# Patient Record
Sex: Female | Born: 1955 | Race: White | Hispanic: No | Marital: Married | Smoking: Former smoker
Health system: Southern US, Community
[De-identification: ages and names within clinical notes are randomized; demographics above are authoritative.]

## PROBLEM LIST (undated history)

## (undated) DIAGNOSIS — F419 Anxiety disorder, unspecified: Secondary | ICD-10-CM

## (undated) DIAGNOSIS — R079 Chest pain, unspecified: Secondary | ICD-10-CM

## (undated) DIAGNOSIS — F32A Depression, unspecified: Secondary | ICD-10-CM

## (undated) DIAGNOSIS — J069 Acute upper respiratory infection, unspecified: Secondary | ICD-10-CM

## (undated) DIAGNOSIS — C3490 Malignant neoplasm of unspecified part of unspecified bronchus or lung: Secondary | ICD-10-CM

## (undated) DIAGNOSIS — K219 Gastro-esophageal reflux disease without esophagitis: Secondary | ICD-10-CM

## (undated) DIAGNOSIS — F329 Major depressive disorder, single episode, unspecified: Secondary | ICD-10-CM

## (undated) HISTORY — DX: Major depressive disorder, single episode, unspecified: F32.9

## (undated) HISTORY — DX: Anxiety disorder, unspecified: F41.9

## (undated) HISTORY — DX: Gastro-esophageal reflux disease without esophagitis: K21.9

## (undated) HISTORY — DX: Chest pain, unspecified: R07.9

## (undated) HISTORY — DX: Depression, unspecified: F32.A

## (undated) HISTORY — DX: Acute upper respiratory infection, unspecified: J06.9

## (undated) HISTORY — PX: LUNG BIOPSY: SHX5088

## (undated) HISTORY — PX: DENTAL SURGERY: SHX609

---

## 2001-03-07 ENCOUNTER — Ambulatory Visit (HOSPITAL_COMMUNITY): Admission: RE | Admit: 2001-03-07 | Discharge: 2001-03-07 | Payer: Self-pay | Admitting: Gastroenterology

## 2001-03-07 ENCOUNTER — Encounter (INDEPENDENT_AMBULATORY_CARE_PROVIDER_SITE_OTHER): Payer: Self-pay | Admitting: *Deleted

## 2002-09-27 ENCOUNTER — Encounter: Admission: RE | Admit: 2002-09-27 | Discharge: 2002-09-27 | Payer: Self-pay | Admitting: Family Medicine

## 2002-09-27 ENCOUNTER — Encounter: Payer: Self-pay | Admitting: Family Medicine

## 2007-04-11 ENCOUNTER — Encounter: Admission: RE | Admit: 2007-04-11 | Discharge: 2007-04-11 | Payer: Self-pay | Admitting: Family Medicine

## 2007-04-27 ENCOUNTER — Encounter: Admission: RE | Admit: 2007-04-27 | Discharge: 2007-04-27 | Payer: Self-pay | Admitting: Family Medicine

## 2007-12-04 ENCOUNTER — Encounter: Admission: RE | Admit: 2007-12-04 | Discharge: 2007-12-04 | Payer: Self-pay | Admitting: Family Medicine

## 2009-01-22 ENCOUNTER — Encounter: Admission: RE | Admit: 2009-01-22 | Discharge: 2009-01-22 | Payer: Self-pay | Admitting: Family Medicine

## 2010-09-10 NOTE — Procedures (Signed)
[place]. [facility]  Patient:    MARIVEL, MCCLARTY Visit Number: [id number] MRN: [id number]          Service Type: END Location: ENDO Attending Physician:  Charna Elizabeth Dictated by:   Anselmo Rod, M.D. Proc. Date: 03/07/01 Admit Date:  03/07/2001   CC:         Gloriajean Dell. Andrey Campanile, M.D./Maureen Renette Butters, F.N.P.                           Procedure Report  DATE OF BIRTH:  December 24, 1955  PROCEDURE PERFORMED:  Colonoscopy with biopsies.  ENDOSCOPIST:  Anselmo Rod, M.D.  INSTRUMENT USED:  Pediatric Olympus colonoscope.  INDICATION FOR PROCEDURE:  Blood in stool and a history of rectal bleeding in a 55 year old white female.  The patient gives a history of weight loss in the recent past but this she claims has been intentional.  There is no family history of colon cancer.  PREPROCEDURE PREPARATION:  Informed consent was procured from the patient. The patient was fasted for eight hours prior to the procedure and prepped with a bottle of magnesium citrate and a gallon of NuLytely the night prior to the procedure.  PREPROCEDURE PHYSICAL:  VITAL SIGNS:  The patient had vital signs.  NECK:  Supple.  CHEST: Clear to auscultation.  S1 and S2 regular.  ABDOMEN:  Soft with normal bowel sounds.  DESCRIPTION OF PROCEDURE:  The patient was placed in the left lateral decubitus position and sedated with 50 mg of Demerol and 5 mg of Versed intravenously.  Once the patient was adequately sedated and maintained on low flow oxygen and continuous cardiac monitoring, the Olympus video colonoscope was advanced from the rectum to the cecum without difficulty.  The patient had an excellent prep except for a few early left sided diverticula.  The entire colon appeared normal up to the terminal ileum.  Three small sessile polyps were biopsied with a cold biopsy forceps from the rectum.  Small internal hemorrhoids are appreciated on retroflexion of the  rectum.  IMPRESSION: 1. Small sessile polyps, question hyperplastic.  Biopsied from the rectum. 2. Early left sided diverticulosis. 3. Small internal hemorrhoids. 4. Otherwise normal appearing colon up to the terminal ileum.  RECOMMENDATIONS: 1. Repeat guaiac testing will be done on an outpatient basis. 2. If the patient continues to have guaiac positive stools an    esophagogastroduodenoscopy and possible small bowel follow through    will be undertaken. 3. Outpatient follow up within the next ten days.Dictated by:   Anselmo Rod, M.D. Attending Physician:  Charna Elizabeth DD:  03/07/01 TD:  03/07/01 Job: [id number]

## 2012-02-08 ENCOUNTER — Other Ambulatory Visit: Payer: Self-pay | Admitting: Family Medicine

## 2012-02-08 DIAGNOSIS — Z1231 Encounter for screening mammogram for malignant neoplasm of breast: Secondary | ICD-10-CM

## 2012-03-07 ENCOUNTER — Ambulatory Visit
Admission: RE | Admit: 2012-03-07 | Discharge: 2012-03-07 | Disposition: A | Discharge status: Home / Self Care | Payer: 59 | Source: Ambulatory Visit | Attending: Family Medicine | Admitting: Family Medicine

## 2012-03-07 DIAGNOSIS — Z1231 Encounter for screening mammogram for malignant neoplasm of breast: Secondary | ICD-10-CM

## 2014-11-10 ENCOUNTER — Other Ambulatory Visit: Payer: Self-pay | Admitting: Family Medicine

## 2014-11-10 ENCOUNTER — Other Ambulatory Visit: Payer: Self-pay

## 2014-11-10 DIAGNOSIS — Z1231 Encounter for screening mammogram for malignant neoplasm of breast: Secondary | ICD-10-CM

## 2014-11-13 ENCOUNTER — Ambulatory Visit: Payer: Self-pay

## 2014-11-19 ENCOUNTER — Ambulatory Visit
Admission: RE | Admit: 2014-11-19 | Discharge: 2014-11-19 | Disposition: A | Discharge status: Home / Self Care | Payer: 59 | Source: Ambulatory Visit | Attending: Family Medicine | Admitting: Family Medicine

## 2014-11-19 DIAGNOSIS — Z1231 Encounter for screening mammogram for malignant neoplasm of breast: Secondary | ICD-10-CM

## 2014-11-20 ENCOUNTER — Other Ambulatory Visit: Payer: Self-pay | Admitting: Family Medicine

## 2014-11-20 DIAGNOSIS — R928 Other abnormal and inconclusive findings on diagnostic imaging of breast: Secondary | ICD-10-CM

## 2014-11-25 ENCOUNTER — Ambulatory Visit
Admission: RE | Admit: 2014-11-25 | Discharge: 2014-11-25 | Disposition: A | Discharge status: Home / Self Care | Payer: 59 | Source: Ambulatory Visit | Attending: Family Medicine | Admitting: Family Medicine

## 2014-11-25 DIAGNOSIS — R928 Other abnormal and inconclusive findings on diagnostic imaging of breast: Secondary | ICD-10-CM

## 2016-01-12 ENCOUNTER — Other Ambulatory Visit: Payer: Self-pay | Admitting: Family Medicine

## 2016-01-12 DIAGNOSIS — Z1231 Encounter for screening mammogram for malignant neoplasm of breast: Secondary | ICD-10-CM

## 2016-01-19 ENCOUNTER — Ambulatory Visit
Admission: RE | Admit: 2016-01-19 | Discharge: 2016-01-19 | Disposition: A | Discharge status: Home / Self Care | Payer: 59 | Source: Ambulatory Visit | Attending: Family Medicine | Admitting: Family Medicine

## 2016-01-19 DIAGNOSIS — Z1231 Encounter for screening mammogram for malignant neoplasm of breast: Secondary | ICD-10-CM

## 2016-01-27 ENCOUNTER — Other Ambulatory Visit: Payer: Self-pay | Admitting: Family Medicine

## 2016-01-27 DIAGNOSIS — R928 Other abnormal and inconclusive findings on diagnostic imaging of breast: Secondary | ICD-10-CM

## 2016-02-16 ENCOUNTER — Ambulatory Visit
Admission: RE | Admit: 2016-02-16 | Discharge: 2016-02-16 | Disposition: A | Discharge status: Home / Self Care | Payer: 59 | Source: Ambulatory Visit | Attending: Family Medicine | Admitting: Family Medicine

## 2016-02-16 DIAGNOSIS — R928 Other abnormal and inconclusive findings on diagnostic imaging of breast: Secondary | ICD-10-CM

## 2018-06-13 ENCOUNTER — Other Ambulatory Visit: Payer: Self-pay | Admitting: Family Medicine

## 2018-06-13 DIAGNOSIS — Z1231 Encounter for screening mammogram for malignant neoplasm of breast: Secondary | ICD-10-CM

## 2018-06-15 ENCOUNTER — Telehealth: Payer: Self-pay

## 2018-06-15 NOTE — Telephone Encounter (Signed)
NOTES ON FILE 

## 2018-07-04 ENCOUNTER — Other Ambulatory Visit: Payer: Self-pay

## 2018-07-04 ENCOUNTER — Ambulatory Visit: Payer: 59 | Admitting: Cardiology

## 2018-07-04 VITALS — BP 118/72 | HR 87 | Ht 61.0 in | Wt 131.0 lb

## 2018-07-04 DIAGNOSIS — R0602 Shortness of breath: Secondary | ICD-10-CM | POA: Diagnosis not present

## 2018-07-04 DIAGNOSIS — R079 Chest pain, unspecified: Secondary | ICD-10-CM | POA: Diagnosis not present

## 2018-07-04 NOTE — Progress Notes (Signed)
Cardiology Office Note    Date:  07/09/2018   ID:  Rebecca May, Rebecca May 1955/09/24, MRN [id number]  PCP:  Eulas Post, MD  Cardiologist:  Fransico Him, MD   Chief Complaint  Patient presents with  . Chest Pain  . Shortness of Breath    History of Present Illness:  Rebecca May is a 63 y.o. female who is being seen today for the evaluation of atypical chest pain at the request of Aletha Halim., PA-C.  This is a 63 year old female with a history of anxiety, GERD, depression referred by her PCP for work-up of a atypical chest pain.  She recently saw her PCP for routine physical exam and mention that she had been having chest pain off and on and will occasionally get upper back pain and jaw pain as well.  She says when she gets it she will rest for a few minutes and the pain usually subsides.    She says that it occurs sporadically a few times a month and then may not have an episode for a few months.  She describes it as a left sided burning occasionally with radiation to the left shoulder and jaw.  It usually lasts 30-40 seconds.  It can occur with exertion and at rest and is associated with SOB but no nausea or diaphoresis.   She does notice some fatigue on occasion.  EKG showed normal sinus rhythm with no ST changes.  Denies any PND, orthopnea, palpitations, dizziness or syncope.  Denies lower extremity edema.  She smoked 1ppd for 40 years.  She has chronic DOE due to smoking.    Past Medical History:  Diagnosis Date  . Anxiety   . Chest pain   . Depression   . GERD (gastroesophageal reflux disease)   . URI (upper respiratory infection)     Past Surgical History:  Procedure Laterality Date  . DENTAL SURGERY      Current Medications: Current Meds  Medication Sig  . citalopram (CELEXA) 20 MG tablet 20 mg. TAKE 1 &1/2 TABLETS BY MOUTH DAILY  . LORazepam (ATIVAN) 0.5 MG tablet 0.5 mg. TAKE 1 TO 2 TABLETS BY MOUTH DAILY AS NEEDED  . pantoprazole (PROTONIX) 40  MG tablet Take 40 mg by mouth daily.    Allergies:   Patient has no known allergies.   Social History   Socioeconomic History  . Marital status: Married    Spouse name: Not on file  . Number of children: Not on file  . Years of education: Not on file  . Highest education level: Not on file  Occupational History  . Not on file  Social Needs  . Financial resource strain: Not on file  . Food insecurity:    Worry: Not on file    Inability: Not on file  . Transportation needs:    Medical: Not on file    Non-medical: Not on file  Tobacco Use  . Smoking status: Not on file  Substance and Sexual Activity  . Alcohol use: Not on file  . Drug use: Not on file  . Sexual activity: Not on file  Lifestyle  . Physical activity:    Days per week: Not on file    Minutes per session: Not on file  . Stress: Not on file  Relationships  . Social connections:    Talks on phone: Not on file    Gets together: Not on file    Attends religious service: Not on file  Active member of club or organization: Not on file    Attends meetings of clubs or organizations: Not on file    Relationship status: Not on file  Other Topics Concern  . Not on file  Social History Narrative  . Not on file     Family History:  The patient's family history includes Breast cancer (age of onset: 36) in her mother; Hypertension in her father and mother.   ROS:   Please see the history of present illness.    ROS All other systems reviewed and are negative.  No flowsheet data found.     PHYSICAL EXAM:   VS:  BP 118/72 (BP Location: Right Arm, Patient Position: Sitting, Cuff Size: Normal)   Pulse 87   Ht 5\' 1"  (1.549 m)   Wt 131 lb (59.4 kg)   BMI 24.75 kg/m    GEN: Well nourished, well developed, in no acute distress  HEENT: normal  Neck: no JVD, carotid bruits, or masses Cardiac: RRR; no murmurs, rubs, or gallops,no edema.  Intact distal pulses bilaterally.  Respiratory:  clear to auscultation  bilaterally, normal work of breathing GI: soft, nontender, nondistended, + BS MS: no deformity or atrophy  Skin: warm and dry, no rash Neuro:  Alert and Oriented x 3, Strength and sensation are intact Psych: euthymic mood, full affect  Wt Readings from Last 3 Encounters:  07/04/18 131 lb (59.4 kg)      Studies/Labs Reviewed:   EKG:  EKG is not ordered today.   Recent Labs: No results found for requested labs within last 8760 hours.   Lipid Panel No results found for: CHOL, TRIG, HDL, CHOLHDL, VLDL, LDLCALC, LDLDIRECT  Additional studies/ records that were reviewed today include:  Office notes from PCP    ASSESSMENT:    1. Chest pain, unspecified type   2. SOB (shortness of breath)      PLAN:  In order of problems listed above:  1.  Chest pain - Her symptoms are atypical and sound GI.  The pain is burning and is very sporadic and exertional as well as nonexertional.  There is no associated sx except for DOE which is chronic from smoking.  Her CRFs include remote tobacco use and post menopausal.  She does have GERD.  I will get a stress myoview to rule out ischemia.   2.  DOE - this is chronic and likely related to smoking.  I will get an echo to assess LVF.      Medication Adjustments/Labs and Tests Ordered: Current medicines are reviewed at length with the patient today.  Concerns regarding medicines are outlined above.  Medication changes, Labs and Tests ordered today are listed in the Patient Instructions below.  Patient Instructions  Medication Instructions:  Your physician recommends that you continue on your current medications as directed. Please refer to the Current Medication list given to you today.  If you need a refill on your cardiac medications before your next appointment, please call your pharmacy.   Lab work: None If you have labs (blood work) drawn today and your tests are completely normal, you will receive your results only by: Marland Kitchen MyChart  Message (if you have MyChart) OR . A paper copy in the mail If you have any lab test that is abnormal or we need to change your treatment, we will call you to review the results.  Testing/Procedures: Your physician has requested that you have an echocardiogram. Echocardiography is a painless test that uses sound  waves to create images of your heart. It provides your doctor with information about the size and shape of your heart and how well your heart's chambers and valves are working. This procedure takes approximately one hour. There are no restrictions for this procedure.  Your physician has requested that you have en exercise stress myoview. For further information please visit [email or web address]. Please follow instruction sheet, as given.  Follow-Up: As needed, following results.       Signed, Fransico Him, MD  07/09/2018 10:28 PM    [address], [place], [place]  [address] Phone: [phone]; Fax: [phone]

## 2018-07-04 NOTE — Patient Instructions (Signed)
Medication Instructions:  Your physician recommends that you continue on your current medications as directed. Please refer to the Current Medication list given to you today.  If you need a refill on your cardiac medications before your next appointment, please call your pharmacy.   Lab work: None If you have labs (blood work) drawn today and your tests are completely normal, you will receive your results only by: Marland Kitchen MyChart Message (if you have MyChart) OR . A paper copy in the mail If you have any lab test that is abnormal or we need to change your treatment, we will call you to review the results.  Testing/Procedures: Your physician has requested that you have an echocardiogram. Echocardiography is a painless test that uses sound waves to create images of your heart. It provides your doctor with information about the size and shape of your heart and how well your heart's chambers and valves are working. This procedure takes approximately one hour. There are no restrictions for this procedure.  Your physician has requested that you have en exercise stress myoview. For further information please visit [email or web address]. Please follow instruction sheet, as given.  Follow-Up: As needed, following results.

## 2018-07-06 ENCOUNTER — Other Ambulatory Visit: Payer: Self-pay

## 2018-07-06 ENCOUNTER — Ambulatory Visit
Admission: RE | Admit: 2018-07-06 | Discharge: 2018-07-06 | Disposition: A | Discharge status: Home / Self Care | Payer: 59 | Source: Ambulatory Visit | Attending: Family Medicine | Admitting: Family Medicine

## 2018-07-06 DIAGNOSIS — Z1231 Encounter for screening mammogram for malignant neoplasm of breast: Secondary | ICD-10-CM

## 2018-07-09 ENCOUNTER — Encounter: Payer: Self-pay | Admitting: Cardiology

## 2018-07-10 ENCOUNTER — Telehealth (HOSPITAL_COMMUNITY): Payer: Self-pay

## 2018-07-10 NOTE — Telephone Encounter (Signed)
Attempted to contact the patient with instructions for her stress test on Thursday July 12, 2018. Asked her to call us back for further info. S.Gila Lauf EMTP

## 2018-07-11 ENCOUNTER — Telehealth (HOSPITAL_COMMUNITY): Payer: Self-pay

## 2018-07-11 NOTE — Telephone Encounter (Signed)
Patient given detailed instructions per Myocardial Perfusion Study Information Sheet for the test on 7:30 at [facility]. Patient notified to arrive 15 minutes early and that it is imperative to arrive on time for appointment to keep from having the test rescheduled.  If you need to cancel or reschedule your appointment, please call the office within 24 hours of your appointment. . Patient verbalized understanding.07/12/2018

## 2018-07-12 ENCOUNTER — Ambulatory Visit (HOSPITAL_BASED_OUTPATIENT_CLINIC_OR_DEPARTMENT_OTHER): Payer: 59

## 2018-07-12 ENCOUNTER — Other Ambulatory Visit: Payer: Self-pay

## 2018-07-12 ENCOUNTER — Ambulatory Visit (HOSPITAL_COMMUNITY): Payer: 59 | Attending: Cardiology

## 2018-07-12 DIAGNOSIS — R0602 Shortness of breath: Secondary | ICD-10-CM | POA: Diagnosis present

## 2018-07-12 DIAGNOSIS — R079 Chest pain, unspecified: Secondary | ICD-10-CM

## 2018-07-12 LAB — MYOCARDIAL PERFUSION IMAGING
CHL CUP MPHR: 156 {beats}/min
CHL CUP NUCLEAR SDS: 0
CSEPPHR: 137 {beats}/min
Estimated workload: 7 METS
Exercise duration (min): 6 min
Exercise duration (sec): 1 s
LVDIAVOL: 44 mL (ref 46–106)
LVSYSVOL: 12 mL
Percent HR: 87 %
RPE: 18
Rest HR: 75 {beats}/min
SRS: 0
SSS: 0
TID: 0.83

## 2018-07-12 LAB — ECHOCARDIOGRAM COMPLETE
HEIGHTINCHES: 61 in
Weight: 2096 oz

## 2018-07-12 MED ORDER — TECHNETIUM TC 99M TETROFOSMIN IV KIT
10.1000 | PACK | Freq: Once | INTRAVENOUS | Status: AC | PRN
  Administered 2018-07-12: 10.1 via INTRAVENOUS
  Filled 2018-07-12: qty 11

## 2018-07-12 MED ORDER — TECHNETIUM TC 99M TETROFOSMIN IV KIT
32.3000 | PACK | Freq: Once | INTRAVENOUS | Status: AC | PRN
  Administered 2018-07-12: 32.3 via INTRAVENOUS
  Filled 2018-07-12: qty 33

## 2019-06-03 ENCOUNTER — Other Ambulatory Visit: Payer: Self-pay | Admitting: Physician Assistant

## 2019-06-03 DIAGNOSIS — R918 Other nonspecific abnormal finding of lung field: Secondary | ICD-10-CM

## 2019-06-11 ENCOUNTER — Ambulatory Visit
Admission: RE | Admit: 2019-06-11 | Discharge: 2019-06-11 | Disposition: A | Discharge status: Home / Self Care | Payer: 59 | Source: Ambulatory Visit | Attending: Physician Assistant | Admitting: Physician Assistant

## 2019-06-11 ENCOUNTER — Encounter: Payer: Self-pay | Admitting: Radiology

## 2019-06-11 ENCOUNTER — Other Ambulatory Visit: Payer: Self-pay | Admitting: Physician Assistant

## 2019-06-11 DIAGNOSIS — R918 Other nonspecific abnormal finding of lung field: Secondary | ICD-10-CM

## 2019-06-11 DIAGNOSIS — R911 Solitary pulmonary nodule: Secondary | ICD-10-CM

## 2019-06-11 MED ORDER — IOPAMIDOL (ISOVUE-300) INJECTION 61%
75.0000 mL | Freq: Once | INTRAVENOUS | Status: AC | PRN
  Administered 2019-06-11: 15:00:00 75 mL via INTRAVENOUS

## 2019-09-17 ENCOUNTER — Emergency Department (HOSPITAL_BASED_OUTPATIENT_CLINIC_OR_DEPARTMENT_OTHER): Payer: 59

## 2019-09-17 ENCOUNTER — Emergency Department (HOSPITAL_BASED_OUTPATIENT_CLINIC_OR_DEPARTMENT_OTHER)
Admission: EM | Admit: 2019-09-17 | Discharge: 2019-09-17 | Disposition: A | Discharge status: Home / Self Care | Payer: 59 | Attending: Emergency Medicine | Admitting: Emergency Medicine

## 2019-09-17 ENCOUNTER — Encounter (HOSPITAL_BASED_OUTPATIENT_CLINIC_OR_DEPARTMENT_OTHER): Payer: Self-pay | Admitting: *Deleted

## 2019-09-17 ENCOUNTER — Other Ambulatory Visit: Payer: Self-pay

## 2019-09-17 DIAGNOSIS — Z79899 Other long term (current) drug therapy: Secondary | ICD-10-CM | POA: Insufficient documentation

## 2019-09-17 DIAGNOSIS — R509 Fever, unspecified: Secondary | ICD-10-CM | POA: Diagnosis present

## 2019-09-17 DIAGNOSIS — Z20822 Contact with and (suspected) exposure to covid-19: Secondary | ICD-10-CM | POA: Insufficient documentation

## 2019-09-17 DIAGNOSIS — C3490 Malignant neoplasm of unspecified part of unspecified bronchus or lung: Secondary | ICD-10-CM | POA: Diagnosis not present

## 2019-09-17 HISTORY — DX: Malignant neoplasm of unspecified part of unspecified bronchus or lung: C34.90

## 2019-09-17 LAB — CBC WITH DIFFERENTIAL/PLATELET
Abs Immature Granulocytes: 0.03 10*3/uL (ref 0.00–0.07)
Basophils Absolute: 0 10*3/uL (ref 0.0–0.1)
Basophils Relative: 1 %
Eosinophils Absolute: 0.4 10*3/uL (ref 0.0–0.5)
Eosinophils Relative: 7 %
HCT: 28.8 % — ABNORMAL LOW (ref 36.0–46.0)
Hemoglobin: 9.6 g/dL — ABNORMAL LOW (ref 12.0–15.0)
Immature Granulocytes: 1 %
Lymphocytes Relative: 26 %
Lymphs Abs: 1.7 10*3/uL (ref 0.7–4.0)
MCH: 29.5 pg (ref 26.0–34.0)
MCHC: 33.3 g/dL (ref 30.0–36.0)
MCV: 88.6 fL (ref 80.0–100.0)
Monocytes Absolute: 1.1 10*3/uL — ABNORMAL HIGH (ref 0.1–1.0)
Monocytes Relative: 18 %
Neutro Abs: 3 10*3/uL (ref 1.7–7.7)
Neutrophils Relative %: 47 %
Platelets: 335 10*3/uL (ref 150–400)
RBC: 3.25 MIL/uL — ABNORMAL LOW (ref 3.87–5.11)
RDW: 13.8 % (ref 11.5–15.5)
WBC: 6.3 10*3/uL (ref 4.0–10.5)
nRBC: 0 % (ref 0.0–0.2)

## 2019-09-17 LAB — COMPREHENSIVE METABOLIC PANEL
ALT: 352 U/L — ABNORMAL HIGH (ref 0–44)
AST: 336 U/L — ABNORMAL HIGH (ref 15–41)
Albumin: 2.5 g/dL — ABNORMAL LOW (ref 3.5–5.0)
Alkaline Phosphatase: 715 U/L — ABNORMAL HIGH (ref 38–126)
Anion gap: 12 (ref 5–15)
BUN: 7 mg/dL — ABNORMAL LOW (ref 8–23)
CO2: 25 mmol/L (ref 22–32)
Calcium: 7.9 mg/dL — ABNORMAL LOW (ref 8.9–10.3)
Chloride: 97 mmol/L — ABNORMAL LOW (ref 98–111)
Creatinine, Ser: 0.53 mg/dL (ref 0.44–1.00)
GFR calc Af Amer: >60 mL/min (ref >60)
GFR calc non Af Amer: >60 mL/min (ref >60)
Glucose, Bld: 100 mg/dL — ABNORMAL HIGH (ref 70–99)
Potassium: 2.9 mmol/L — ABNORMAL LOW (ref 3.5–5.1)
Sodium: 134 mmol/L — ABNORMAL LOW (ref 135–145)
Total Bilirubin: 0.5 mg/dL (ref 0.3–1.2)
Total Protein: 6.1 g/dL — ABNORMAL LOW (ref 6.5–8.1)

## 2019-09-17 LAB — URINALYSIS, ROUTINE W REFLEX MICROSCOPIC
Bilirubin Urine: NEGATIVE
Glucose, UA: NEGATIVE mg/dL
Ketones, ur: NEGATIVE mg/dL
Leukocytes,Ua: NEGATIVE
Nitrite: NEGATIVE
Protein, ur: NEGATIVE mg/dL
Specific Gravity, Urine: <1.005 — ABNORMAL LOW (ref 1.005–1.030)
pH: 7.5 (ref 5.0–8.0)

## 2019-09-17 LAB — URINALYSIS, MICROSCOPIC (REFLEX)

## 2019-09-17 LAB — LACTIC ACID, PLASMA: Lactic Acid, Venous: 0.7 mmol/L (ref 0.5–1.9)

## 2019-09-17 LAB — LIPASE, BLOOD: Lipase: 15 U/L (ref 11–51)

## 2019-09-17 LAB — SARS CORONAVIRUS 2 BY RT PCR (HOSPITAL ORDER, PERFORMED IN ~~LOC~~ HOSPITAL LAB): SARS Coronavirus 2: NEGATIVE

## 2019-09-17 MED ORDER — LACTATED RINGERS IV BOLUS
1000.0000 mL | Freq: Once | INTRAVENOUS | Status: AC
  Administered 2019-09-17: 1000 mL via INTRAVENOUS

## 2019-09-17 MED ORDER — HEPARIN SOD (PORK) LOCK FLUSH 100 UNIT/ML IV SOLN
INTRAVENOUS | Status: AC
  Filled 2019-09-17: qty 5

## 2019-09-17 MED ORDER — PIPERACILLIN-TAZOBACTAM 3.375 G IVPB 30 MIN
3.3750 g | Freq: Once | INTRAVENOUS | Status: AC
  Administered 2019-09-17: 3.375 g via INTRAVENOUS
  Filled 2019-09-17 (×2): qty 50

## 2019-09-17 MED ORDER — LEVOFLOXACIN 750 MG PO TABS: 750.0000 mg | ORAL_TABLET | Freq: Every day | ORAL | 0 refills | Status: AC

## 2019-09-17 MED ORDER — PIPERACILLIN-TAZOBACTAM 3.375 G IVPB 30 MIN
3.3750 g | Freq: Three times a day (TID) | INTRAVENOUS | Status: DC
  Filled 2019-09-17: qty 50

## 2019-09-17 MED ORDER — SODIUM CHLORIDE 0.9 % IV SOLN: INTRAVENOUS | Status: DC | PRN

## 2019-09-17 MED ORDER — IBUPROFEN 400 MG PO TABS
400.0000 mg | ORAL_TABLET | Freq: Once | ORAL | Status: AC
  Administered 2019-09-17: 400 mg via ORAL
  Filled 2019-09-17: qty 1

## 2019-09-17 MED ORDER — ACETAMINOPHEN 500 MG PO TABS
1000.0000 mg | ORAL_TABLET | Freq: Once | ORAL | Status: AC
  Administered 2019-09-17: 1000 mg via ORAL
  Filled 2019-09-17: qty 2

## 2019-09-17 MED ORDER — POTASSIUM CHLORIDE CRYS ER 20 MEQ PO TBCR
40.0000 meq | EXTENDED_RELEASE_TABLET | Freq: Once | ORAL | Status: AC
  Administered 2019-09-17: 40 meq via ORAL
  Filled 2019-09-17: qty 2

## 2019-09-17 MED ORDER — SODIUM CHLORIDE 0.9 % IV SOLN
100.0000 mg | Freq: Once | INTRAVENOUS | Status: AC
  Administered 2019-09-17: 100 mg via INTRAVENOUS
  Filled 2019-09-17 (×2): qty 100

## 2019-09-17 MED ORDER — ACETAMINOPHEN 325 MG PO TABS
650.0000 mg | ORAL_TABLET | Freq: Once | ORAL | Status: AC
  Administered 2019-09-17: 650 mg via ORAL
  Filled 2019-09-17: qty 2

## 2019-09-17 MED ORDER — IOHEXOL 300 MG/ML  SOLN
100.0000 mL | Freq: Once | INTRAMUSCULAR | Status: AC | PRN
  Administered 2019-09-17: 100 mL via INTRAVENOUS

## 2019-09-17 NOTE — ED Notes (Signed)
Pt ambulatory with steady gait 

## 2019-09-17 NOTE — ED Triage Notes (Signed)
Pt co fever at home, hx lung Ca with first dose of chemo 2 weeks ago

## 2019-09-17 NOTE — ED Provider Notes (Signed)
[facility] EMERGENCY DEPARTMENT Provider Note   CSN: [id number] Arrival date & time: 09/17/19  0028     History Chief Complaint  Patient presents with  . Fever    Rebecca May is a 64 y.o. female.  Patient presents to the emergency department for evaluation of fever which developed earlier tonight.  Patient recently diagnosed with lung cancer, received her first chemotherapy treatment 2-1/2 weeks ago.  She developed a fever tonight and has some generalized weakness.  No associated URI symptoms, cough, congestion, nausea, vomiting, diarrhea.  No abdominal pain or urinary symptoms.  She does report that she has had some "indigestion" symptoms over the last few days.  Patient does report that she had a tick bite several days ago.  No associated rash.        Past Medical History:  Diagnosis Date  . Anxiety   . Chest pain   . Depression   . GERD (gastroesophageal reflux disease)   . Lung cancer ([place])   . URI (upper respiratory infection)     There are no problems to display for this patient.   Past Surgical History:  Procedure Laterality Date  . DENTAL SURGERY    . LUNG BIOPSY       OB History   No obstetric history on file.     Family History  Problem Relation Age of Onset  . Breast cancer Mother 34  . Hypertension Mother   . Hypertension Father     Social History   Tobacco Use  . Smoking status: Former Smoker    Packs/day: 1.00    Years: 40.00    Pack years: 40.00    Types: Cigarettes    Quit date: 07/24/2019    Years since quitting: 0.1  Substance Use Topics  . Alcohol use: Not Currently  . Drug use: Not Currently    Home Medications Prior to Admission medications   Medication Sig Start Date End Date Taking? Authorizing Provider  citalopram (CELEXA) 20 MG tablet 20 mg. TAKE 1 &1/2 TABLETS BY MOUTH DAILY    [provider]  LORazepam (ATIVAN) 0.5 MG tablet 0.5 mg. TAKE 1 TO 2 TABLETS BY MOUTH DAILY AS NEEDED    [provider]  pantoprazole (PROTONIX) 40 MG tablet Take 40 mg by mouth daily.    [provider]    Allergies    Patient has no known allergies.  Review of Systems   Review of Systems  Constitutional: Positive for fatigue and fever.  All other systems reviewed and are negative.   Physical Exam Updated Vital Signs BP 112/65   Pulse 96   Temp 100.1 F (37.8 C) (Oral)   Resp 15   Ht '5\' 1"'$  (1.549 m)   Wt 52.2 kg   SpO2 95%   BMI 21.73 kg/m   Physical Exam Vitals and nursing note reviewed.  Constitutional:      General: She is not in acute distress.    Appearance: Normal appearance. She is well-developed.  HENT:     Head: Normocephalic and atraumatic.     Right Ear: Hearing normal.     Left Ear: Hearing normal.     Nose: Nose normal.  Eyes:     Conjunctiva/sclera: Conjunctivae normal.     Pupils: Pupils are equal, round, and reactive to light.  Cardiovascular:     Rate and Rhythm: Regular rhythm. Tachycardia present.     Heart sounds: S1 normal and S2 normal. No murmur. No friction  rub. No gallop.   Pulmonary:     Effort: Pulmonary effort is normal. No respiratory distress.     Breath sounds: Normal breath sounds.  Chest:     Chest wall: No tenderness.  Abdominal:     General: Bowel sounds are normal.     Palpations: Abdomen is soft.     Tenderness: There is no abdominal tenderness. There is no guarding or rebound. Negative signs include Murphy's sign and McBurney's sign.     Hernia: No hernia is present.  Musculoskeletal:        General: Normal range of motion.     Cervical back: Normal range of motion and neck supple.  Skin:    General: Skin is warm and dry.     Findings: No rash.     Comments: Tiny red spot on right lower leg where previous tick bite was.  No surrounding erythema or warmth.  No fluctuance.  Neurological:     Mental Status: She is alert and oriented to person, place, and time.     GCS: GCS eye subscore is 4. GCS verbal subscore is  5. GCS motor subscore is 6.     Cranial Nerves: No cranial nerve deficit.     Sensory: No sensory deficit.     Coordination: Coordination normal.  Psychiatric:        Speech: Speech normal.        Behavior: Behavior normal.        Thought Content: Thought content normal.     ED Results / Procedures / Treatments   Labs (all labs ordered are listed, but only abnormal results are displayed) Labs Reviewed  CBC WITH DIFFERENTIAL/PLATELET - Abnormal; Notable for the following components:      Result Value   RBC 3.25 (*)    Hemoglobin 9.6 (*)    HCT 28.8 (*)    Monocytes Absolute 1.1 (*)    All other components within normal limits  COMPREHENSIVE METABOLIC PANEL - Abnormal; Notable for the following components:   Sodium 134 (*)    Potassium 2.9 (*)    Chloride 97 (*)    Glucose, Bld 100 (*)    BUN 7 (*)    Calcium 7.9 (*)    Total Protein 6.1 (*)    Albumin 2.5 (*)    AST 336 (*)    ALT 352 (*)    Alkaline Phosphatase 715 (*)    All other components within normal limits  URINALYSIS, ROUTINE W REFLEX MICROSCOPIC - Abnormal; Notable for the following components:   Specific Gravity, Urine <1.005 (*)    Hgb urine dipstick TRACE (*)    All other components within normal limits  URINALYSIS, MICROSCOPIC (REFLEX) - Abnormal; Notable for the following components:   Bacteria, UA RARE (*)    All other components within normal limits  URINE CULTURE  CULTURE, BLOOD (ROUTINE X 2)  CULTURE, BLOOD (ROUTINE X 2)  SARS CORONAVIRUS 2 BY RT PCR (HOSPITAL ORDER, [facility])  LACTIC ACID, PLASMA  LIPASE, BLOOD  ROCKY MTN SPOTTED FVR ABS PNL(IGG+IGM)  B. BURGDORFI ANTIBODIES    EKG EKG Interpretation  Date/Time:  Tuesday Sep 17 2019 01:18:45 EDT Ventricular Rate:  111 PR Interval:    QRS Duration: 96 QT Interval:  334 QTC Calculation: 454 R Axis:   70 Text Interpretation: Sinus tachycardia Otherwise within normal limits Confirmed by Orpah Greek  [phone]) on 09/17/2019 2:21:43 AM   Radiology CT ABDOMEN PELVIS W CONTRAST  Result Date: 09/17/2019 CLINICAL DATA:  Fever at home.  Lung cancer on chemotherapy. EXAM: CT ABDOMEN AND PELVIS WITH CONTRAST TECHNIQUE: Multidetector CT imaging of the abdomen and pelvis was performed using the standard protocol following bolus administration of intravenous contrast. CONTRAST:  143m OMNIPAQUE IOHEXOL 300 MG/ML  SOLN COMPARISON:  09/09/2019 PET-CT FINDINGS: Lower chest: Nodularity in the left costophrenic sulcus the patient has known lung cancer. Retrocrural lymphadenopathy. Hepatobiliary: No focal liver abnormality, reference abdominal MRI 07/10/2019.No evidence of biliary obstruction or stone. Pancreas: Unremarkable. Spleen: Unremarkable. Adrenals/Urinary Tract: Negative adrenals. No hydronephrosis or stone. Unremarkable bladder. Stomach/Bowel: No obstruction. No appendicitis or other bowel inflammation. Subcentimeter lipoma at the distal duodenum. Vascular/Lymphatic: No acute vascular abnormality. No mass or adenopathy. Reproductive:Negative Other: No ascites or pneumoperitoneum. Musculoskeletal: No acute abnormalities. Degenerative facet spurring. IMPRESSION: 1. No acute finding or explanation for symptoms. 2. Active left-sided bronchogenic carcinoma with left pleural nodularity and retrocrural adenopathy. Electronically Signed   By: JMonte FantasiaM.D.   On: 09/17/2019 04:26   DG Chest Port 1 View  Result Date: 09/17/2019 CLINICAL DATA:  Fever EXAM: PORTABLE CHEST 1 VIEW COMPARISON:  Radiograph 08/28/2019 FINDINGS: Chronic elevation left hemidiaphragm. Likely some adjacent atelectatic changes. Some more reticular areas of scarring and architectural distortion with postsurgical change seen in the left chest. Some residual left pleural thickening is noted, possibly trace fluid and/or scarring. No consolidative opacity, convincing features of edema, or visible pneumothorax. No right effusion. The  cardiomediastinal contours are unremarkable. No acute osseous or soft tissue abnormality. Degenerative changes are present in the imaged spine and shoulders. IMPRESSION: 1. Persistent elevation of the left hemidiaphragm with adjacent atelectasis. 2. Some residual left pleural thickening is noted, possibly trace fluid and/or scarring. 3. Left upper lung scarring and architectural distortion similar to prior. 4. No acute cardiopulmonary findings. Electronically Signed   By: PLovena LeM.D.   On: 09/17/2019 02:05    Procedures Procedures (including critical care time)  Medications Ordered in ED Medications  0.9 %  sodium chloride infusion ( Intravenous New Bag/Given 09/17/19 0317)  doxycycline (VIBRAMYCIN) 100 mg in sodium chloride 0.9 % 250 mL IVPB (has no administration in time range)  potassium chloride SA (KLOR-CON) CR tablet 40 mEq (has no administration in time range)  lactated ringers bolus 1,000 mL ( Intravenous Stopped 09/17/19 0242)  ibuprofen (ADVIL) tablet 400 mg (400 mg Oral Given 09/17/19 0133)  acetaminophen (TYLENOL) tablet 650 mg (650 mg Oral Given 09/17/19 0133)  piperacillin-tazobactam (ZOSYN) IVPB 3.375 g ( Intravenous Stopped 09/17/19 0316)  iohexol (OMNIPAQUE) 300 MG/ML solution 100 mL (100 mLs Intravenous Contrast Given 09/17/19 0352)    ED Course  I have reviewed the triage vital signs and the nursing notes.  Pertinent labs & imaging results that were available during my care of the patient were reviewed by me and considered in my medical decision making (see chart for details).    MDM Rules/Calculators/A&P                      Patient presents to the emergency department for evaluation of fever.  Patient with recent diagnosis of lung cancer, initiated chemotherapy 2 and half weeks ago.  She developed shaking chills tonight.  She does not have any obvious symptoms to explain the fever.  She has been experiencing "indigestion" but has a fairly benign abdominal exam.  She  did not have a leukocytosis, but is also not neutropenic..Marland Kitchen She was very tachycardic at arrival but this  resolved with IV fluids and antipyretics.  She was noted to have significant elevations of AST, ALT and alk phos.  Bilirubin is normal.  Patient was empirically administered Zosyn to cover for cholangitis, cholecystitis, etc.  A CT scan was performed.  No acute abnormality is noted.  Patient has had [facility] spotted fever and Lyme disease titer sent.  She does not have any obvious rash that would suggest either of these illnesses, but with her at least partially immunocompromised state, must consider the possibility.  Patient therefore given IV doxycycline in addition to Zosyn.  Patient requesting transfer to [facility] as her cancer care was initiated there.  Discussed with Dr. Vashti Hey, patient accepted but no bed assignment available at this time.  CRITICAL CARE Performed by: Orpah Greek   Total critical care time: 30 minutes  Critical care time was exclusive of separately billable procedures and treating other patients.  Critical care was necessary to treat or prevent imminent or life-threatening deterioration.  Critical care was time spent personally by me on the following activities: development of treatment plan with patient and/or surrogate as well as nursing, discussions with consultants, evaluation of patient's response to treatment, examination of patient, obtaining history from patient or surrogate, ordering and performing treatments and interventions, ordering and review of laboratory studies, ordering and review of radiographic studies, pulse oximetry and re-evaluation of patient's condition.   Final Clinical Impression(s) / ED Diagnoses Final diagnoses:  Fever, unspecified fever cause    Rx / DC Orders ED Discharge Orders    None       Santosha Jividen, Gwenyth Allegra, MD 09/18/19 [id number]

## 2019-09-17 NOTE — ED Provider Notes (Signed)
64 yo F with a chief complaints of a fever. Patient is to start chemotherapy. She last had chemotherapy 2 weeks ago. Actually was her first session. Was seen overnight. I see the patient in signout from Dr. Lynn Ito. Plan was for admission to [facility]. Unfortunately there is a bed shortage there. The patient has now been in the ED for over 14 hours. I discussed the case with the patient's primary oncologist as the patient is well-appearing and would prefer to go home. As I have not found a source of her infection and she is well-appearing and not neutropenic he felt it would be reasonable to start her on Levaquin and they would follow her up in the office.   Deno Etienne, DO 09/17/19 1409

## 2019-09-17 NOTE — ED Notes (Signed)
Pt discharged to home. Discharge instructions have been discussed with patient and/or family members. Pt verbally acknowledges understanding d/c instructions, and endorses comprehension to checkout at registration before leaving.  °

## 2019-09-17 NOTE — ED Notes (Signed)
Contacted PAL (Carla) at [facility] will call back

## 2019-09-17 NOTE — Discharge Instructions (Signed)
Please call your oncologist.  Return for worsening symptoms.

## 2019-09-17 NOTE — ED Notes (Signed)
Pt ambulatory with steady gait to restroom. Patient denies pain and is resting comfortably, denies any needs at this time.

## 2019-09-18 LAB — URINE CULTURE: Culture: <10000 — AB

## 2019-09-18 LAB — B. BURGDORFI ANTIBODIES: B burgdorferi Ab IgG+IgM: <0.91 {ISR} (ref 0.00–0.90)

## 2019-09-19 LAB — ROCKY MTN SPOTTED FVR ABS PNL(IGG+IGM)
RMSF IgG: NEGATIVE
RMSF IgM: 0.33 {index} (ref 0.00–0.89)

## 2019-09-22 LAB — CULTURE, BLOOD (ROUTINE X 2)
Culture: NO GROWTH
Culture: NO GROWTH
Special Requests: ADEQUATE
Special Requests: ADEQUATE

## 2020-03-18 IMAGING — CT CT CHEST W/ CM
2 of 4 series · 14 of 36 positions shown, 17 images · IV contrast (iopamidol)
Comparison: 01/22/2009

CLINICAL DATA: Cough and shortness of breath. Follow-up right lung
nodule.

EXAM:
CT CHEST WITH CONTRAST
TECHNIQUE: Multidetector CT imaging of the chest was performed during
intravenous contrast administration.
CONTRAST:  75mL X8PC43-REE IOPAMIDOL (X8PC43-REE) INJECTION 61%

[Series 2: chest 2.00 br40 s3 · axial · 0.60mm/px · z∈[+1526,+1788]mm · 11 of 155 slices shown, 14 images (1 of 2)]
[im 12/155  mediastinal]
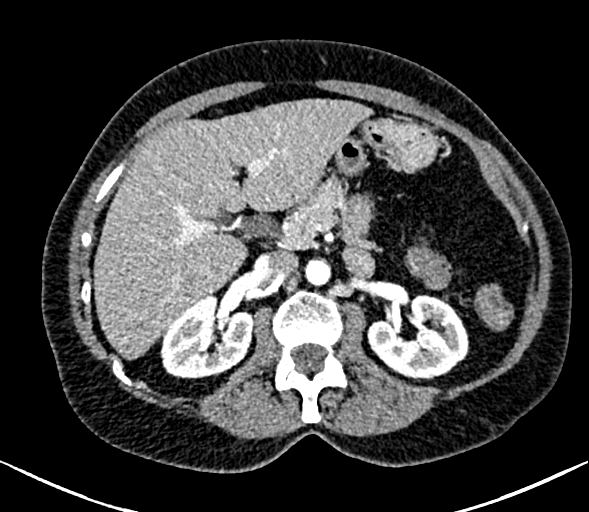
[im 12/155  lung]
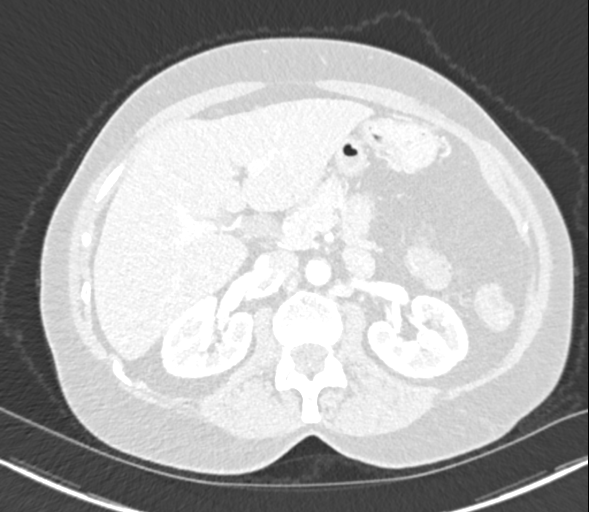
[im 24/155  lung]
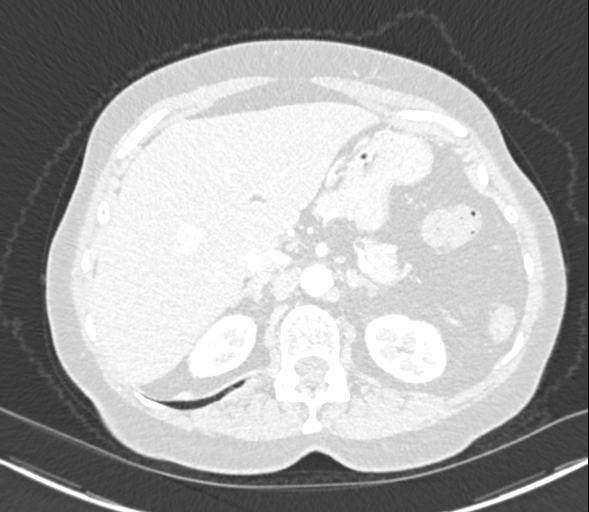
[im 36/155  lung]
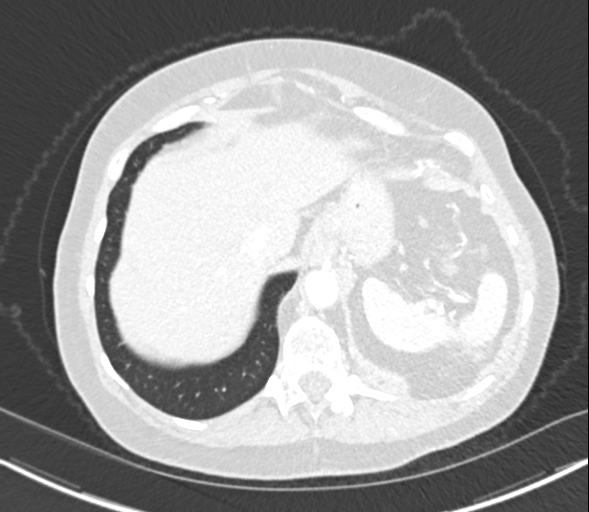
[im 48/155  lung]
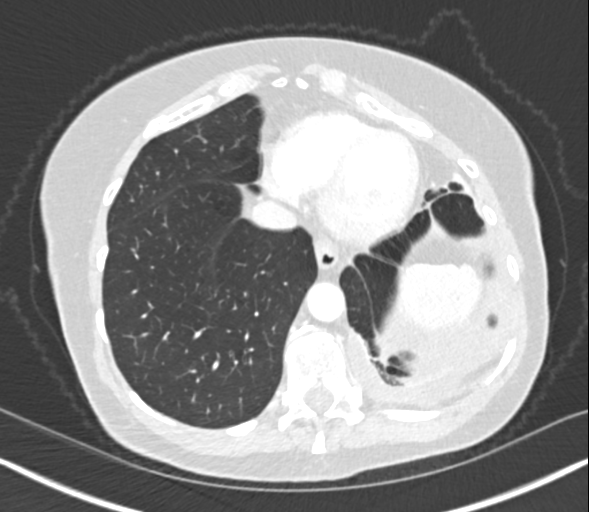
[im 60/155  mediastinal]
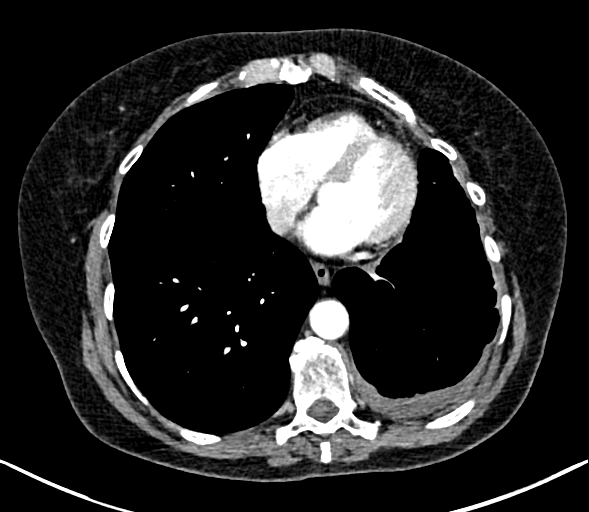
[im 60/155  lung]
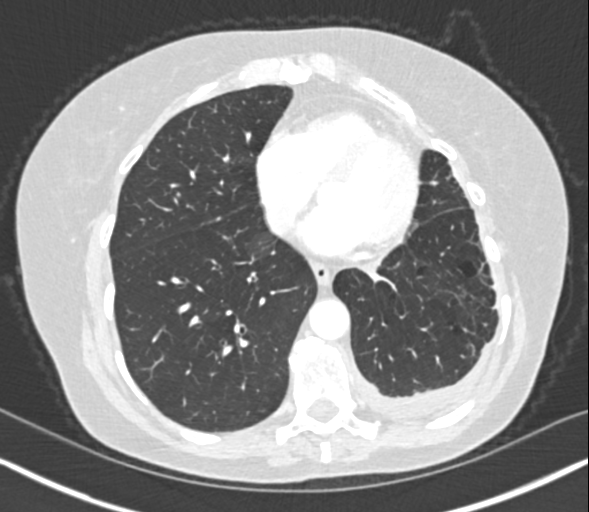
[im 83/155  lung]
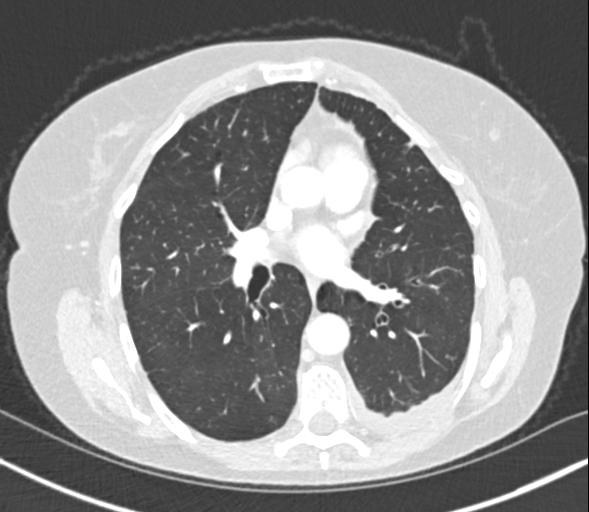
[im 95/155  lung]
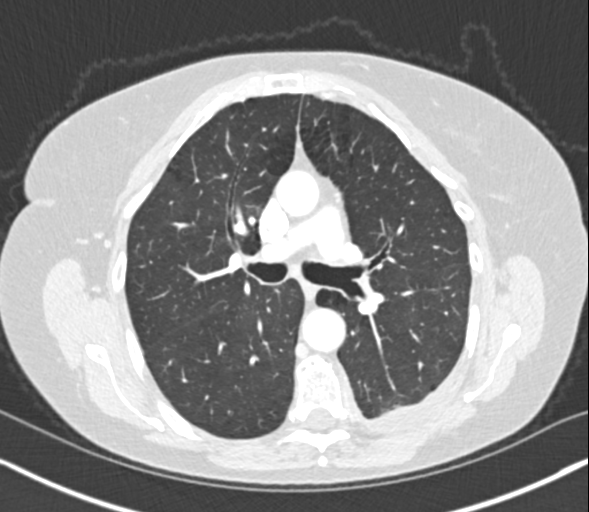
[im 107/155  lung]
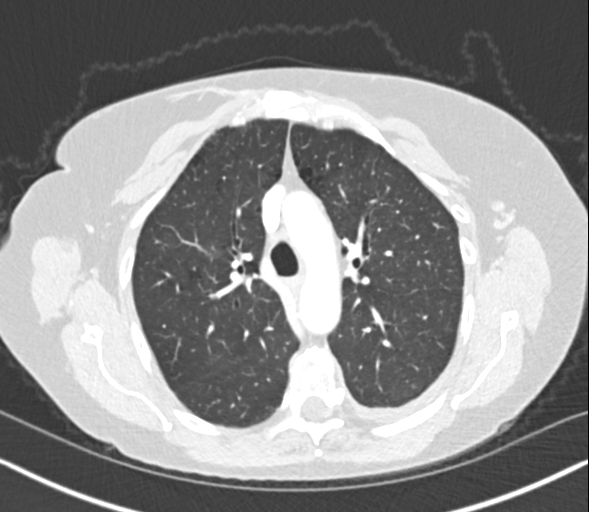
[im 119/155  mediastinal]
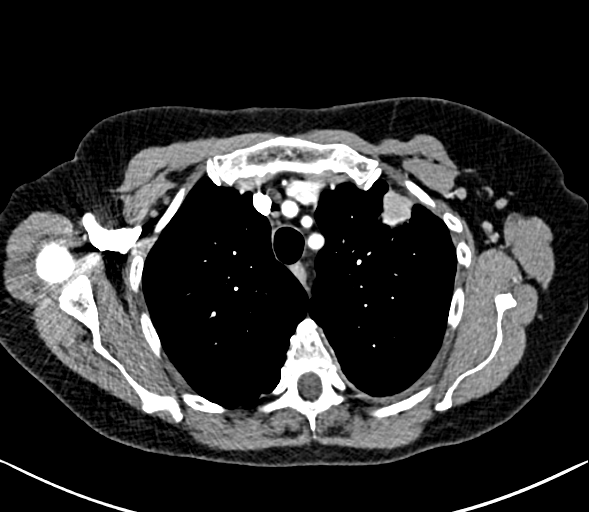
[im 119/155  lung]
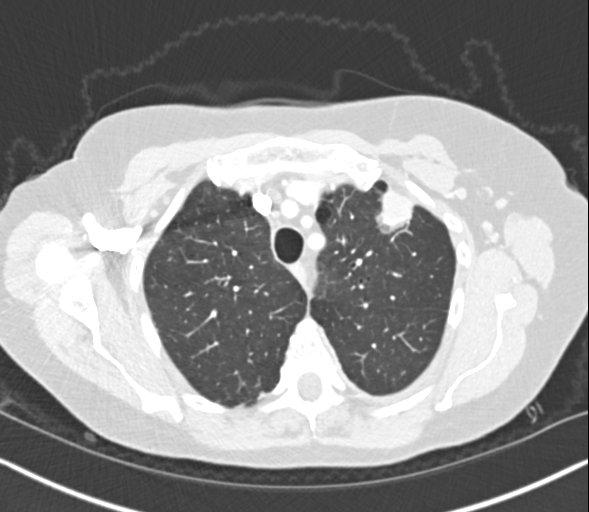
[im 131/155  lung]
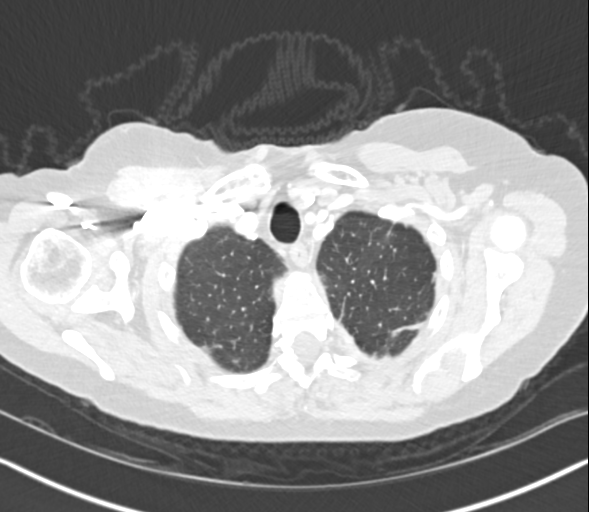
[im 143/155  lung]
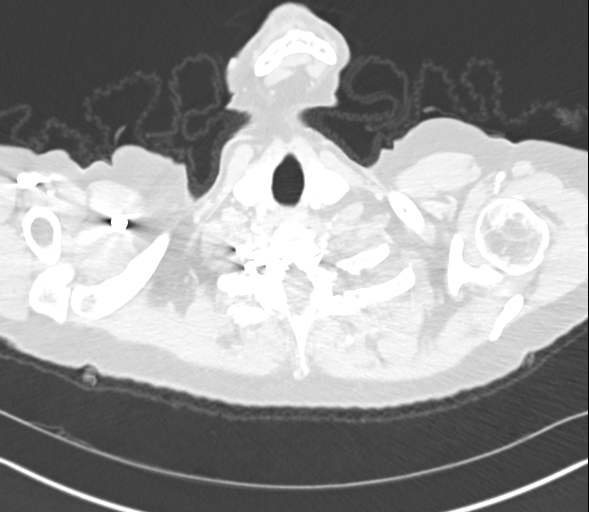

[Series 4: chest 2.00 br40 s3 · coronal · 0.61mm/px · 3 of 152 slices shown (2 of 2)]
[im 31/152  lung]
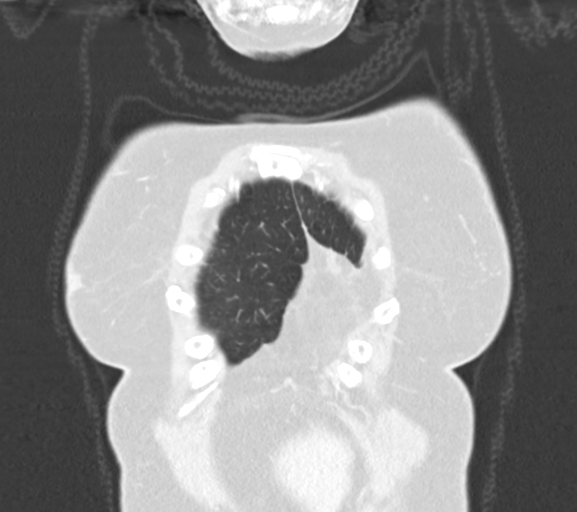
[im 61/152  lung]
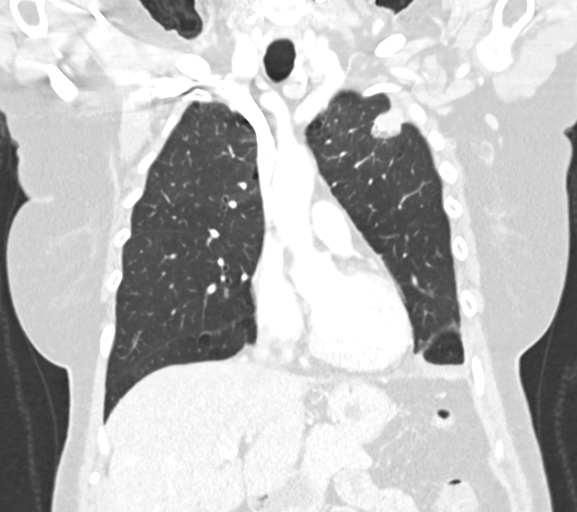
[im 91/152  lung]
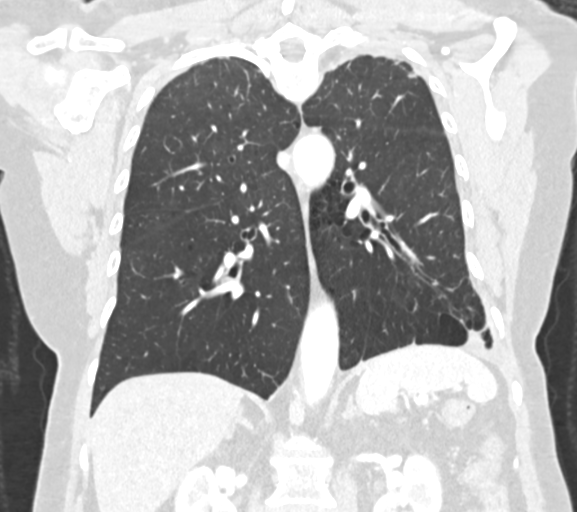

[14 of 36 positions shown; findings below may reference images not displayed]

FINDINGS: Cardiovascular:  No acute findings.

Mediastinum/Nodes: No masses or pathologically enlarged lymph nodes
identified.

Lungs/Pleura: 2.5 x 2.3 cm pulmonary nodule is seen in the anterior
left upper lobe on image 38/8, which is new since previous study.
This abuts the adjacent pleural surface, and a small left pleural
effusion is seen.

No other suspicious pulmonary nodules or masses are seen. Mild
centrilobular emphysema is noted.

Upper Abdomen: A 2 cm hypervascular mass is seen in the central
right hepatic lobe which is indeterminate.

Musculoskeletal:  No suspicious bone lesions.
IMPRESSION: 1. 2.5 cm anterior left upper lobe pulmonary nodule, highly
suspicious for primary bronchogenic carcinoma.
2. This nodule abuts the left anterior pleural surface, with small
left pleural effusion noted; pleural involvement cannot be excluded.
3. No evidence of thoracic lymphadenopathy.
4. 2 cm hypervascular mass in the central right hepatic lobe.
Abdomen MRI without and with contrast is recommended for further
characterization.

Emphysema ([id number]).

## 2020-06-24 IMAGING — CT CT ABD-PELV W/ CM
2 of 5 series · 16 of 46 positions shown, 18 images · IV contrast (Omnipaque)
Comparison: 09/09/2019 PET-CT

CLINICAL DATA: Fever at home.  Lung cancer on chemotherapy.

EXAM:
CT ABDOMEN AND PELVIS WITH CONTRAST
TECHNIQUE: Multidetector CT imaging of the abdomen and pelvis was performed
using the standard protocol following bolus administration of
intravenous contrast.
CONTRAST:  100mL OMNIPAQUE IOHEXOL 300 MG/ML  SOLN

[Series 2: axial st · axial · 0.71mm/px · z∈[-432,-48]mm · 13 of 89 slices shown, 15 images]
[im 6/89  soft-tissue]
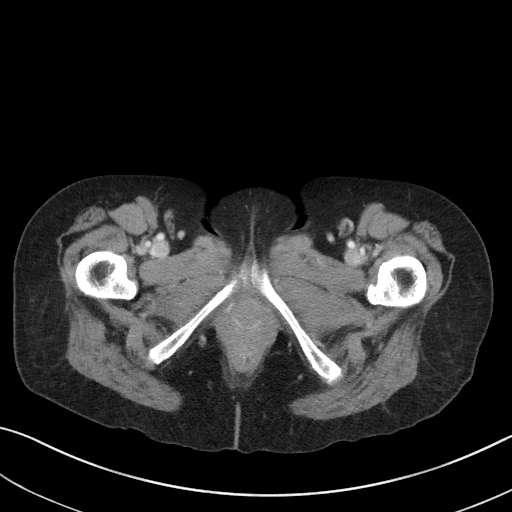
[im 6/89  bone]
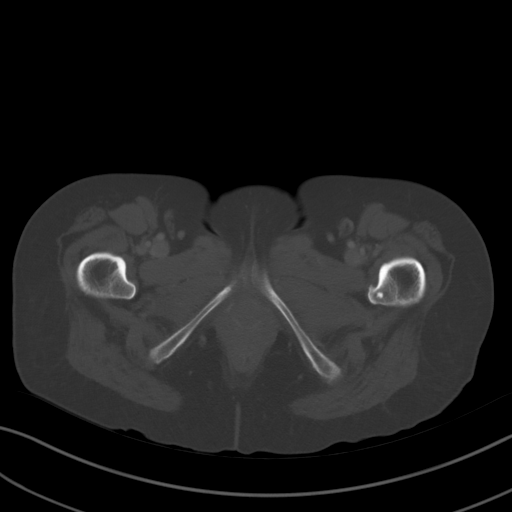
[im 11/89  soft-tissue]
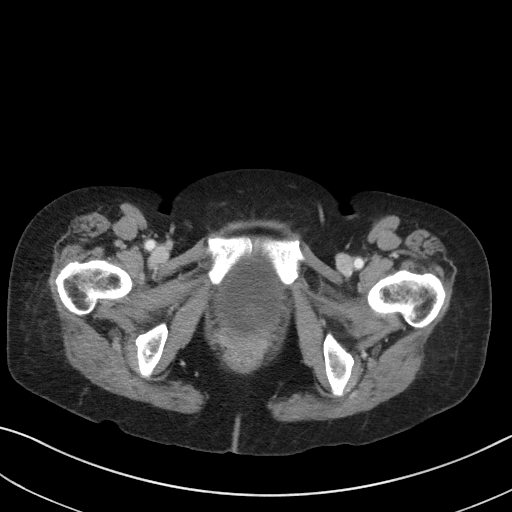
[im 21/89  soft-tissue]
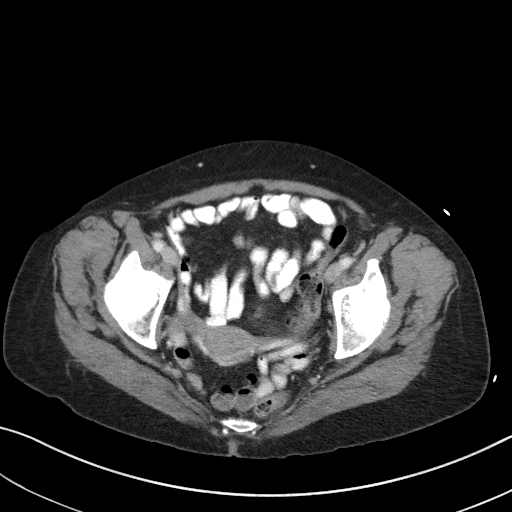
[im 26/89  soft-tissue]
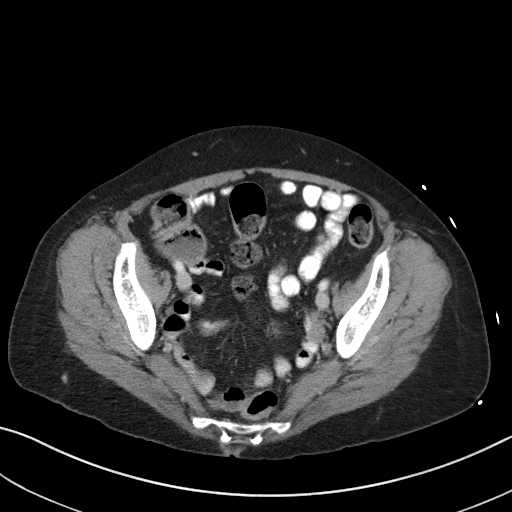
[im 32/89  soft-tissue]
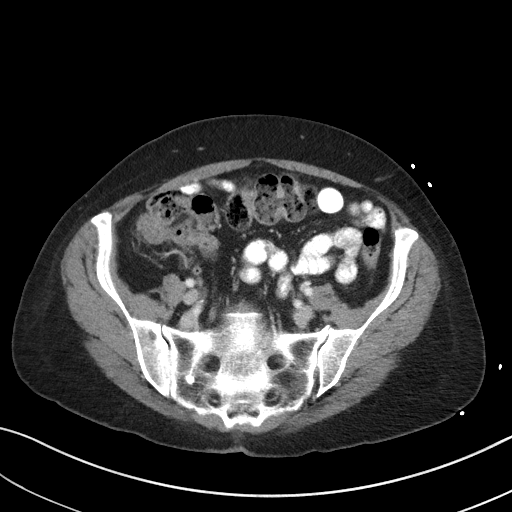
[im 37/89  soft-tissue]
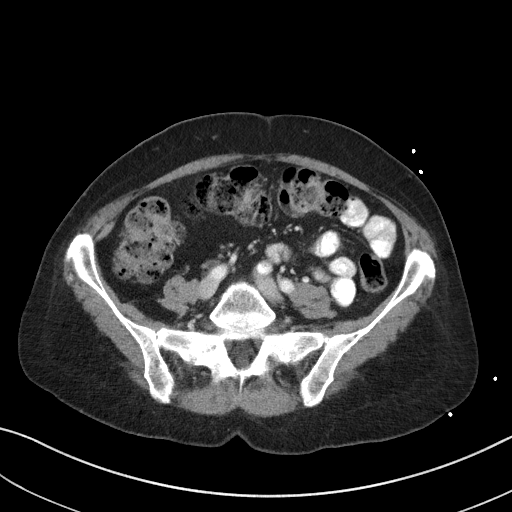
[im 47/89  soft-tissue]
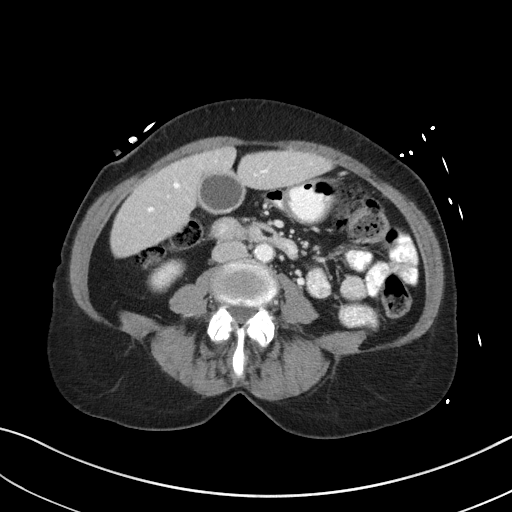
[im 52/89  soft-tissue]
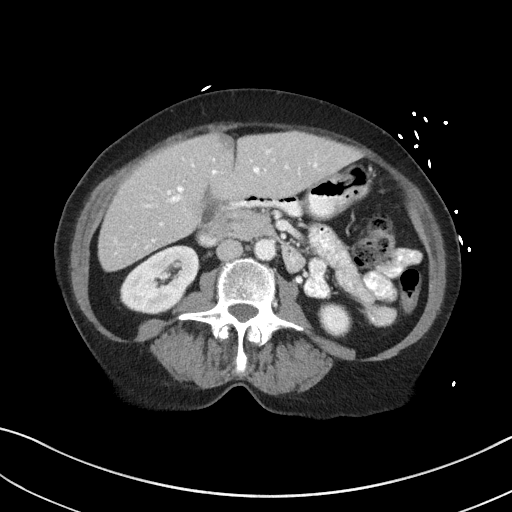
[im 57/89  soft-tissue]
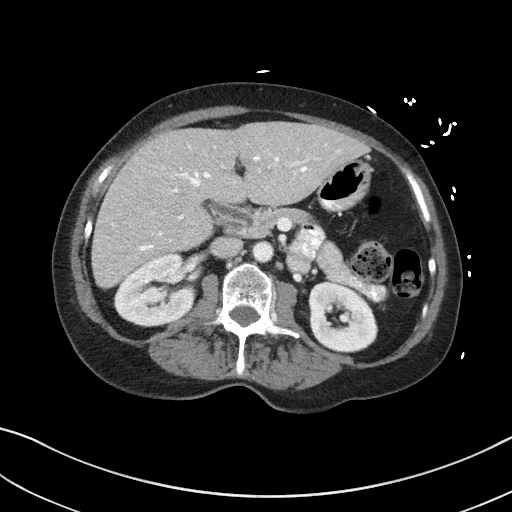
[im 57/89  bone]
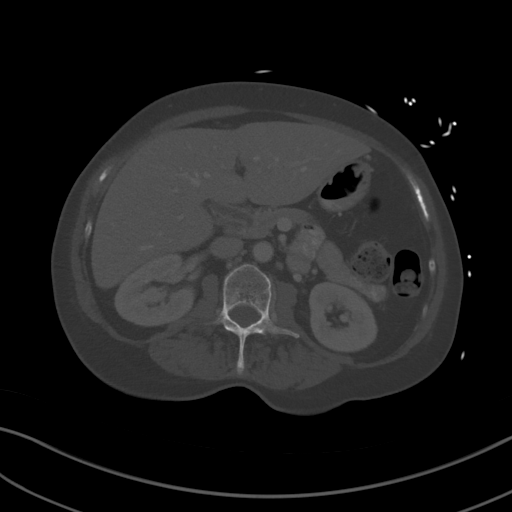
[im 63/89  soft-tissue]
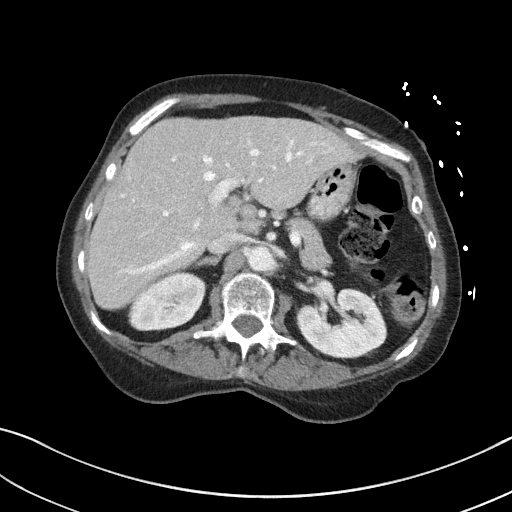
[im 68/89  soft-tissue]
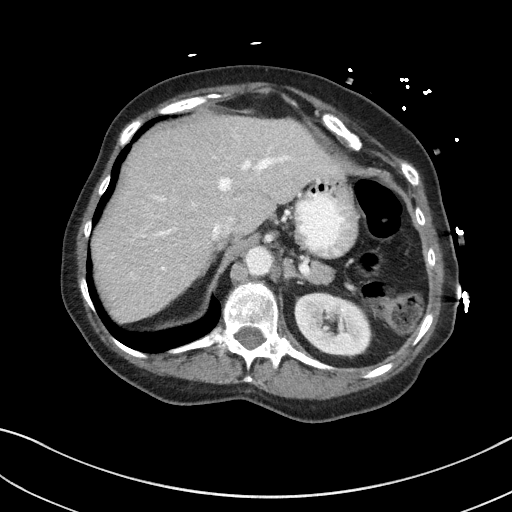
[im 78/89  soft-tissue]
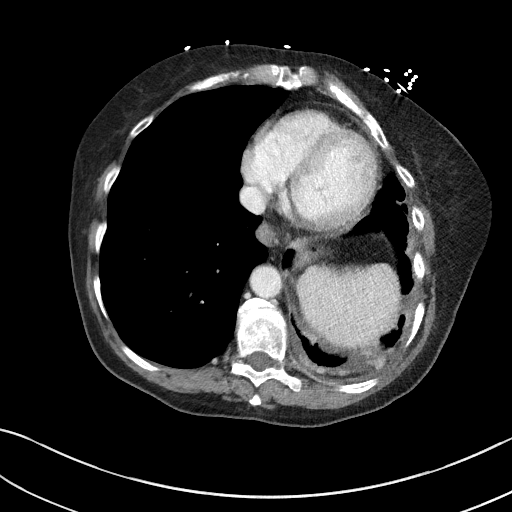
[im 83/89  soft-tissue]
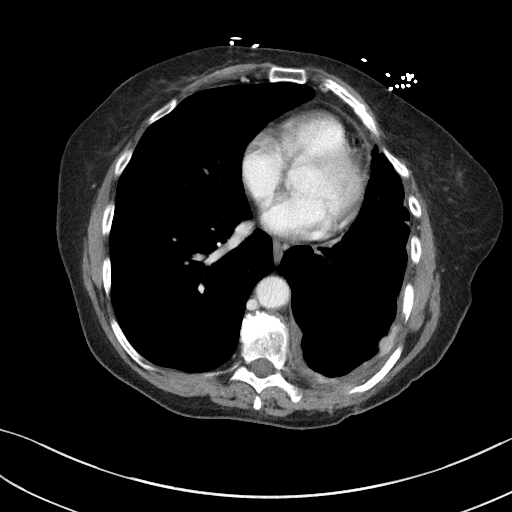

[Series 5: coronal st · coronal · 0.66mm/px · 3 of 85 slices shown]
[im 29/85  soft-tissue]
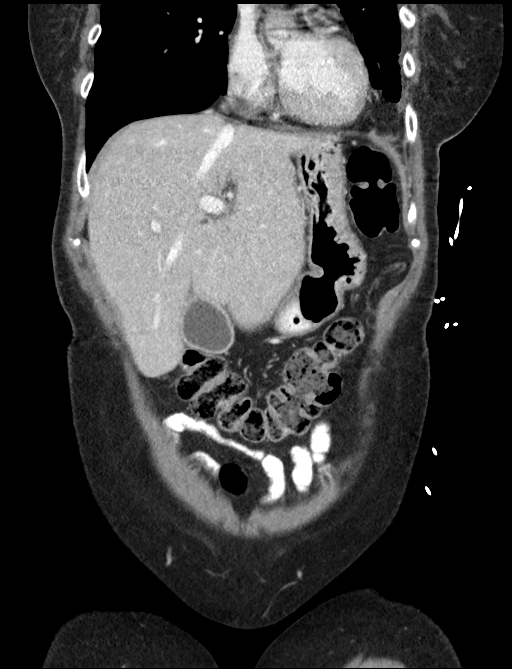
[im 38/85  soft-tissue]
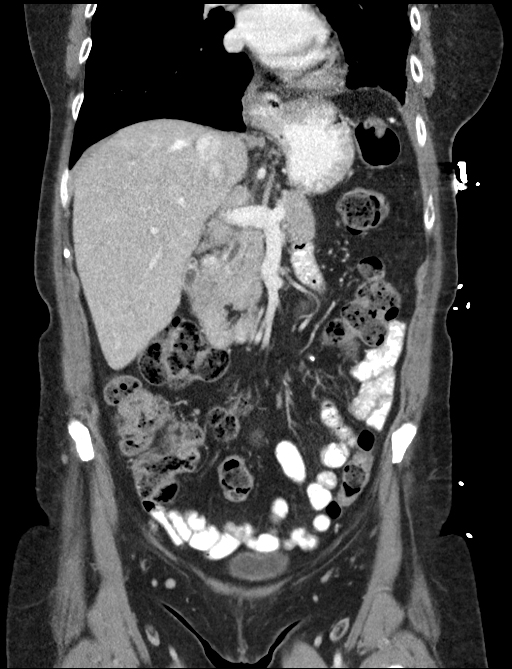
[im 47/85  soft-tissue]
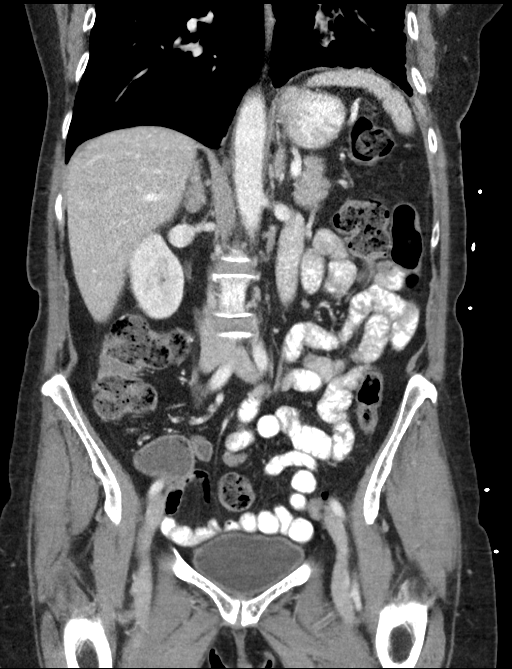

[16 of 46 positions shown; findings below may reference images not displayed]

FINDINGS: Lower chest: Nodularity in the left costophrenic sulcus the patient
has known lung cancer. Retrocrural lymphadenopathy.

Hepatobiliary: No focal liver abnormality, reference abdominal MRI
07/10/2019.No evidence of biliary obstruction or stone.

Pancreas: Unremarkable.

Spleen: Unremarkable.

Adrenals/Urinary Tract: Negative adrenals. No hydronephrosis or
stone. Unremarkable bladder.

Stomach/Bowel: No obstruction. No appendicitis or other bowel
inflammation. Subcentimeter lipoma at the distal duodenum.

Vascular/Lymphatic: No acute vascular abnormality. No mass or
adenopathy.

Reproductive:Negative

Other: No ascites or pneumoperitoneum.

Musculoskeletal: No acute abnormalities. Degenerative facet
spurring.
IMPRESSION: 1. No acute finding or explanation for symptoms.
2. Active left-sided bronchogenic carcinoma with left pleural
nodularity and retrocrural adenopathy.

## 2020-06-29 ENCOUNTER — Telehealth: Payer: Self-pay | Admitting: Family Medicine

## 2020-06-29 NOTE — Telephone Encounter (Signed)
I have not actually seen this patient ..   does she have a primary??

## 2020-06-29 NOTE — Telephone Encounter (Signed)
Geri from [facility] is wanting to know if Dr. Elease Hashimoto will be the attending while the patient is on Hospice.  [place]  [phone]

## 2020-06-30 NOTE — Telephone Encounter (Signed)
Redge Gainer, informed her of message, she will contact patient again to inquire who she has seen I the past.

## 2020-07-24 DEATH — deceased
